# Patient Record
Sex: Female | Born: 1970 | Race: Black or African American | Hispanic: No | Marital: Married | Smoking: Never smoker
Health system: Southern US, Community
[De-identification: ages and names within clinical notes are randomized; demographics above are authoritative.]

## PROBLEM LIST (undated history)

## (undated) DIAGNOSIS — I10 Essential (primary) hypertension: Secondary | ICD-10-CM

## (undated) HISTORY — PX: BACK SURGERY: SHX140

## (undated) HISTORY — PX: ABDOMINAL HYSTERECTOMY: SHX81

---

## 2020-07-12 ENCOUNTER — Other Ambulatory Visit: Payer: Self-pay

## 2020-07-12 ENCOUNTER — Emergency Department (HOSPITAL_BASED_OUTPATIENT_CLINIC_OR_DEPARTMENT_OTHER)
Admission: EM | Admit: 2020-07-12 | Discharge: 2020-07-12 | Disposition: A | Discharge status: Home / Self Care | Payer: BLUE CROSS/BLUE SHIELD | Attending: Emergency Medicine | Admitting: Emergency Medicine

## 2020-07-12 ENCOUNTER — Encounter (HOSPITAL_BASED_OUTPATIENT_CLINIC_OR_DEPARTMENT_OTHER): Payer: Self-pay

## 2020-07-12 ENCOUNTER — Emergency Department (HOSPITAL_BASED_OUTPATIENT_CLINIC_OR_DEPARTMENT_OTHER): Payer: BLUE CROSS/BLUE SHIELD

## 2020-07-12 DIAGNOSIS — K56600 Partial intestinal obstruction, unspecified as to cause: Secondary | ICD-10-CM | POA: Diagnosis not present

## 2020-07-12 DIAGNOSIS — R1013 Epigastric pain: Secondary | ICD-10-CM | POA: Diagnosis present

## 2020-07-12 DIAGNOSIS — I10 Essential (primary) hypertension: Secondary | ICD-10-CM | POA: Diagnosis not present

## 2020-07-12 HISTORY — DX: Essential (primary) hypertension: I10

## 2020-07-12 LAB — CBC WITH DIFFERENTIAL/PLATELET
Abs Immature Granulocytes: 0.03 10*3/uL (ref 0.00–0.07)
Basophils Absolute: 0 10*3/uL (ref 0.0–0.1)
Basophils Relative: 0 %
Eosinophils Absolute: 0 10*3/uL (ref 0.0–0.5)
Eosinophils Relative: 0 %
HCT: 38.4 % (ref 36.0–46.0)
Hemoglobin: 13.4 g/dL (ref 12.0–15.0)
Immature Granulocytes: 0 %
Lymphocytes Relative: 16 %
Lymphs Abs: 1.1 10*3/uL (ref 0.7–4.0)
MCH: 30.9 pg (ref 26.0–34.0)
MCHC: 34.9 g/dL (ref 30.0–36.0)
MCV: 88.7 fL (ref 80.0–100.0)
Monocytes Absolute: 0.2 10*3/uL (ref 0.1–1.0)
Monocytes Relative: 2 %
Neutro Abs: 5.8 10*3/uL (ref 1.7–7.7)
Neutrophils Relative %: 82 %
Platelets: 354 10*3/uL (ref 150–400)
RBC: 4.33 MIL/uL (ref 3.87–5.11)
RDW: 12.1 % (ref 11.5–15.5)
WBC: 7.1 10*3/uL (ref 4.0–10.5)
nRBC: 0 % (ref 0.0–0.2)

## 2020-07-12 LAB — COMPREHENSIVE METABOLIC PANEL
ALT: 15 U/L (ref 0–44)
AST: 24 U/L (ref 15–41)
Albumin: 3.5 g/dL (ref 3.5–5.0)
Alkaline Phosphatase: 51 U/L (ref 38–126)
Anion gap: 12 (ref 5–15)
BUN: 11 mg/dL (ref 6–20)
CO2: 18 mmol/L — ABNORMAL LOW (ref 22–32)
Calcium: 7.8 mg/dL — ABNORMAL LOW (ref 8.9–10.3)
Chloride: 106 mmol/L (ref 98–111)
Creatinine, Ser: 0.7 mg/dL (ref 0.44–1.00)
GFR, Estimated: >60 mL/min (ref >60)
Glucose, Bld: 162 mg/dL — ABNORMAL HIGH (ref 70–99)
Potassium: 3.4 mmol/L — ABNORMAL LOW (ref 3.5–5.1)
Sodium: 136 mmol/L (ref 135–145)
Total Bilirubin: 1.2 mg/dL (ref 0.3–1.2)
Total Protein: 6.5 g/dL (ref 6.5–8.1)

## 2020-07-12 LAB — URINALYSIS, ROUTINE W REFLEX MICROSCOPIC
Bilirubin Urine: NEGATIVE
Glucose, UA: NEGATIVE mg/dL
Hgb urine dipstick: NEGATIVE
Ketones, ur: NEGATIVE mg/dL
Leukocytes,Ua: NEGATIVE
Nitrite: NEGATIVE
Protein, ur: NEGATIVE mg/dL
Specific Gravity, Urine: 1.02 (ref 1.005–1.030)
pH: 8.5 — ABNORMAL HIGH (ref 5.0–8.0)

## 2020-07-12 LAB — LIPASE, BLOOD: Lipase: 26 U/L (ref 11–51)

## 2020-07-12 MED ORDER — IOHEXOL 300 MG/ML  SOLN
100.0000 mL | Freq: Once | INTRAMUSCULAR | Status: AC | PRN
  Administered 2020-07-12: 100 mL via INTRAVENOUS

## 2020-07-12 MED ORDER — MORPHINE SULFATE (PF) 4 MG/ML IV SOLN
4.0000 mg | Freq: Once | INTRAVENOUS | Status: AC
  Administered 2020-07-12: 4 mg via INTRAVENOUS
  Filled 2020-07-12: qty 1

## 2020-07-12 MED ORDER — SODIUM CHLORIDE 0.9 % IV BOLUS
1000.0000 mL | Freq: Once | INTRAVENOUS | Status: AC
  Administered 2020-07-12: 1000 mL via INTRAVENOUS

## 2020-07-12 MED ORDER — ONDANSETRON HCL 4 MG/2ML IJ SOLN
4.0000 mg | Freq: Once | INTRAMUSCULAR | Status: AC
  Administered 2020-07-12: 4 mg via INTRAVENOUS
  Filled 2020-07-12: qty 2

## 2020-07-12 MED ORDER — ONDANSETRON 4 MG PO TBDP: 4.0000 mg | ORAL_TABLET | Freq: Three times a day (TID) | ORAL | 0 refills | Status: AC | PRN

## 2020-07-12 MED ORDER — HYDROCODONE-ACETAMINOPHEN 5-325 MG PO TABS
1.0000 | ORAL_TABLET | ORAL | 0 refills | Status: AC | PRN
Start: 2020-07-12 — End: ?

## 2020-07-12 NOTE — ED Notes (Signed)
CT awaiting results of CMP prior to imaging due to [facility] radiology protocols.

## 2020-07-12 NOTE — ED Notes (Signed)
AVS reviewed with pt and significant other, discussed the two Rx by the EDP as well, also discussed clear liquid diet with pt. Copy of AVS provided, opportunity for questions provided as well

## 2020-07-12 NOTE — Discharge Instructions (Signed)
Return immediately if symptoms worsen. 

## 2020-07-12 NOTE — ED Notes (Signed)
Pt aware of need for urine specimen, unable to provide at this time, IV fluids infusing. 

## 2020-07-12 NOTE — ED Provider Notes (Signed)
[facility] EMERGENCY DEPARTMENT Provider Note   CSN: [id number] Arrival date & time: 07/12/20  0847     History Chief Complaint  Patient presents with  . Abdominal Pain    Marie Evans is a 49 y.o. female.  Pt presents to the ED today with abdominal pain and n/v.  Pt said she is here visiting family.  She felt fine yesterday and woke up in the middle of the night with epigastric pain and n/v.  Pt denies any f/c.  She has had a hyst.        Past Medical History:  Diagnosis Date  . Hypertension     There are no problems to display for this patient.   Past Surgical History:  Procedure Laterality Date  . ABDOMINAL HYSTERECTOMY    . BACK SURGERY       OB History   No obstetric history on file.     History reviewed. No pertinent family history.  Social History   Tobacco Use  . Smoking status: Never Smoker  Substance Use Topics  . Alcohol use: Not Currently  . Drug use: Never    Home Medications Prior to Admission medications   Medication Sig Start Date End Date Taking? Authorizing Provider  HYDROcodone-acetaminophen (NORCO/VICODIN) 5-325 MG tablet Take 1 tablet by mouth every 4 (four) hours as needed. 07/12/20   Jacalyn Lefevre, MD  ondansetron (ZOFRAN ODT) 4 MG disintegrating tablet Take 1 tablet (4 mg total) by mouth every 8 (eight) hours as needed for nausea or vomiting. 07/12/20   Jacalyn Lefevre, MD    Allergies    Penicillins  Review of Systems   Review of Systems  Gastrointestinal: Positive for abdominal pain, nausea and vomiting.  All other systems reviewed and are negative.   Physical Exam Updated Vital Signs BP 137/86 (BP Location: Left Arm)   Pulse 64   Temp 97.8 F (36.6 C) (Oral)   Resp 16   Ht 5\' 2"  (1.575 m)   Wt 70.8 kg   SpO2 100%   BMI 28.53 kg/m   Physical Exam Vitals and nursing note reviewed.  Constitutional:      Appearance: She is well-developed. She is ill-appearing.  HENT:     Head:  Normocephalic and atraumatic.     Mouth/Throat:     Mouth: Mucous membranes are dry.  Eyes:     Extraocular Movements: Extraocular movements intact.     Pupils: Pupils are equal, round, and reactive to light.  Cardiovascular:     Rate and Rhythm: Normal rate and regular rhythm.  Pulmonary:     Effort: Pulmonary effort is normal.     Breath sounds: Normal breath sounds.  Abdominal:     General: Abdomen is flat.     Palpations: Abdomen is soft.     Tenderness: There is abdominal tenderness in the epigastric area.  Skin:    General: Skin is warm.     Capillary Refill: Capillary refill takes less than 2 seconds.  Neurological:     General: No focal deficit present.     Mental Status: She is alert and oriented to person, place, and time.  Psychiatric:        Mood and Affect: Mood normal.        Behavior: Behavior normal.     ED Results / Procedures / Treatments   Labs (all labs ordered are listed, but only abnormal results are displayed) Labs Reviewed  URINALYSIS, ROUTINE W REFLEX MICROSCOPIC - Abnormal; Notable for the  following components:      Result Value   APPearance CLOUDY (*)    pH 8.5 (*)    All other components within normal limits  COMPREHENSIVE METABOLIC PANEL - Abnormal; Notable for the following components:   Potassium 3.4 (*)    CO2 18 (*)    Glucose, Bld 162 (*)    Calcium 7.8 (*)    All other components within normal limits  CBC WITH DIFFERENTIAL/PLATELET  LIPASE, BLOOD    EKG None  Radiology CT ABDOMEN PELVIS W CONTRAST  Result Date: 07/12/2020 CLINICAL DATA:  Abdominal pain EXAM: CT ABDOMEN AND PELVIS WITH CONTRAST TECHNIQUE: Multidetector CT imaging of the abdomen and pelvis was performed using the standard protocol following bolus administration of intravenous contrast. CONTRAST:  OMNIPAQUE IOHEXOL 300 MG/ML  SOLN COMPARISON:  None. FINDINGS: Lower chest: Lung bases are clear. Hepatobiliary: Liver is within normal limits. No  suspicious/enhancing hepatic lesions. Pancreas: Within normal limits. Spleen: Within normal limits. Adrenals/Urinary Tract: Adrenal glands are within normal limits. Numerous bilateral renal cysts, some of which are mildly irregular, but without enhancement, benign (Bosniak I-II). 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 No hydronephrosis. Bladder is partially obscured by streak artifact but grossly unremarkable. 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 Dilated loops of small bowel in the left mid abdomen, suggesting at least partial small bowel obstruction, with narrowing/tethering of multiple loops in the central lower abdomen (coronal image 49), suggesting adhesions. No pneumatosis or free air. Normal appendix (series 2/image 51). Vascular/Lymphatic: No evidence of abdominal aortic aneurysm. No suspicious abdominopelvic lymphadenopathy. Reproductive: Status post hysterectomy. No adnexal masses. Other: Small volume pelvic ascites. Musculoskeletal: Thoracolumbar spine fixation hardware, incompletely visualized. IMPRESSION: Dilated loops of small bowel in the left mid abdomen, suggesting at least partial small bowel obstruction, likely on the basis of adhesions in the central lower abdomen. Small volume pelvic ascites.  No pneumatosis or free air. Electronically Signed   By: Charline Bills M.D.   On: 07/12/2020 11:33    Procedures Procedures (including critical care time)  Medications Ordered in ED Medications  sodium chloride 0.9 % bolus 1,000 mL (0 mLs Intravenous Stopped 07/12/20 1058)  morphine 4 MG/ML injection 4 mg (4 mg Intravenous Given 07/12/20 0926)  ondansetron (ZOFRAN) injection 4 mg (4 mg Intravenous Given 07/12/20 0924)  iohexol (OMNIPAQUE) 300 MG/ML solution 100 mL (100 mLs Intravenous Contrast Given 07/12/20 1112)    ED Course  I have reviewed the triage vital signs and the nursing notes.  Pertinent labs &  imaging results that were available during my care of the patient were reviewed by me and considered in my medical decision making (see chart for details).    MDM Rules/Calculators/A&P                          Pt is feeling much better.  She is offered admission, but does not want to stay in the hospital.  She is able to tolerate po fluids.  She knows to return if her sx worsen.   Final Clinical Impression(s) / ED Diagnoses Final diagnoses:  Partial small bowel obstruction (HCC)    Rx / DC Orders ED Discharge Orders         Ordered    ondansetron (ZOFRAN ODT) 4 MG disintegrating tablet  Every 8 hours PRN        07/12/20 1255    HYDROcodone-acetaminophen (NORCO/VICODIN) 5-325 MG tablet  Every 4  hours PRN        07/12/20 1255           Jacalyn Lefevre, MD 07/12/20 1257

## 2020-07-12 NOTE — ED Triage Notes (Signed)
Pt reports upper abdominal pain with n/v since last night. Pt actively vomiting during triage.

## 2021-11-03 IMAGING — CT CT ABD-PELV W/ CM
2 of 5 series · 16 of 46 positions shown, 18 images · IV contrast (Omnipaque)
Comparison: None.

CLINICAL DATA: Abdominal pain

EXAM:
CT ABDOMEN AND PELVIS WITH CONTRAST
TECHNIQUE: Multidetector CT imaging of the abdomen and pelvis was performed
using the standard protocol following bolus administration of
intravenous contrast.
CONTRAST:  100mL OMNIPAQUE IOHEXOL 300 MG/ML  SOLN

[Series 2: axial (person_name) (person_name) · axial · 0.82mm/px · z∈[+582,+922]mm · 13 of 78 slices shown, 15 images]
[im 5/78  soft-tissue]
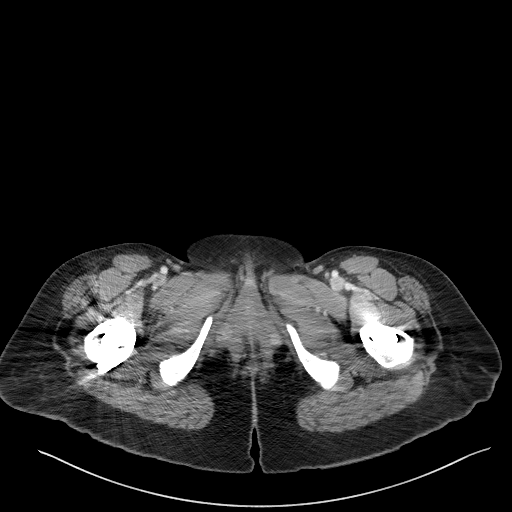
[im 5/78  bone]
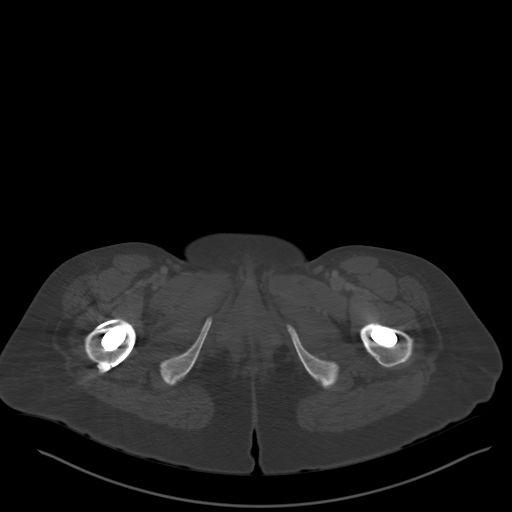
[im 10/78  soft-tissue]
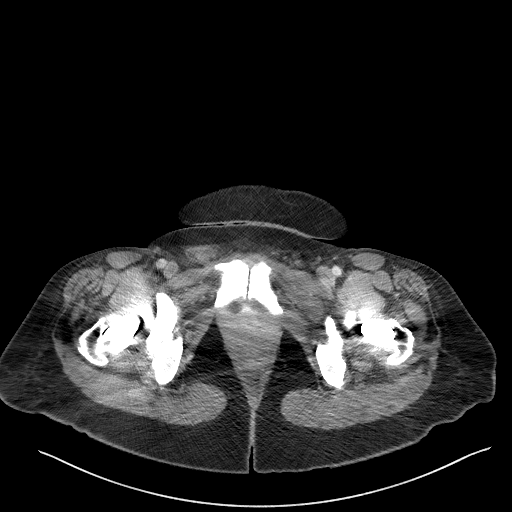
[im 19/78  soft-tissue]
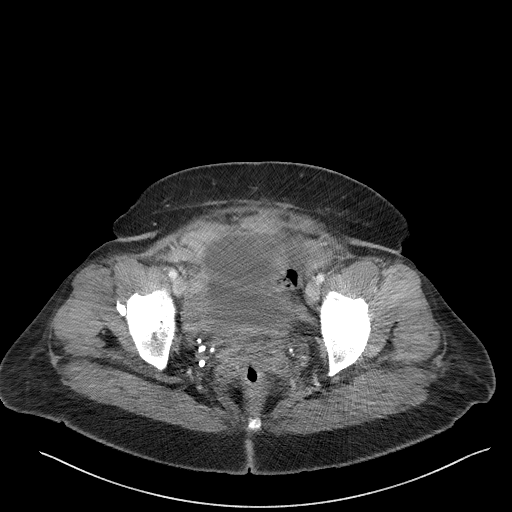
[im 23/78  soft-tissue]
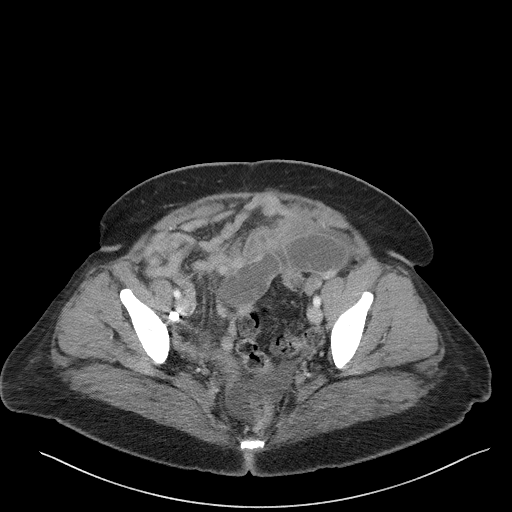
[im 28/78  soft-tissue]
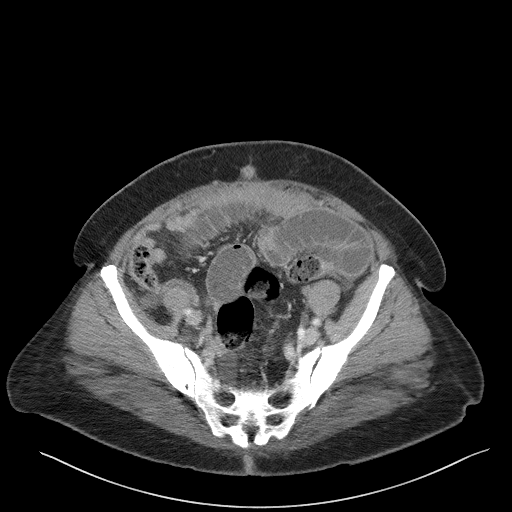
[im 32/78  soft-tissue]
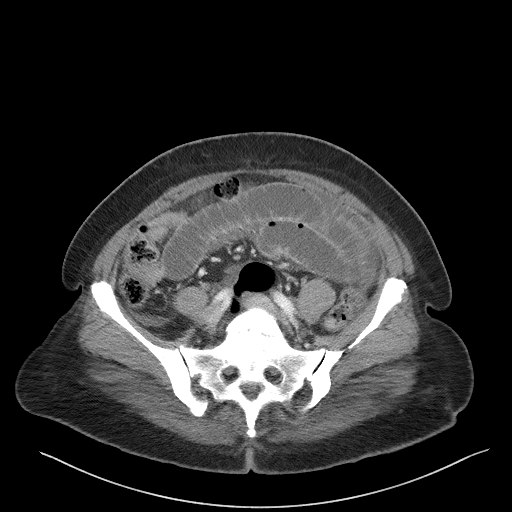
[im 41/78  soft-tissue]
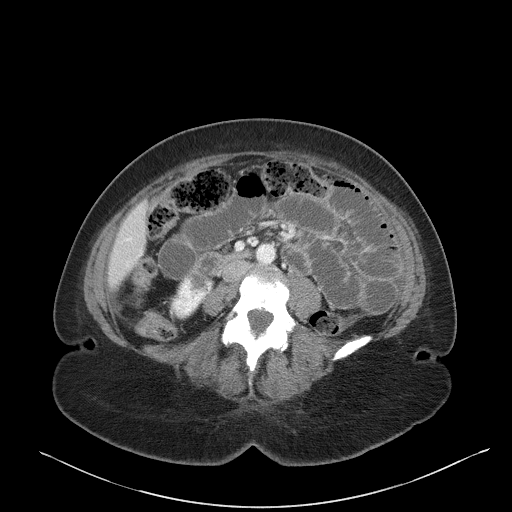
[im 46/78  soft-tissue]
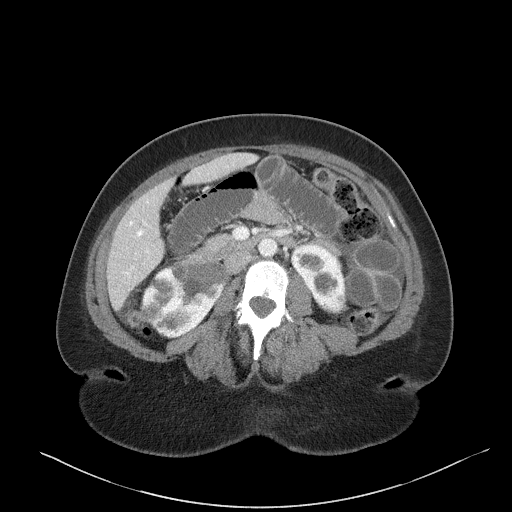
[im 50/78  soft-tissue]
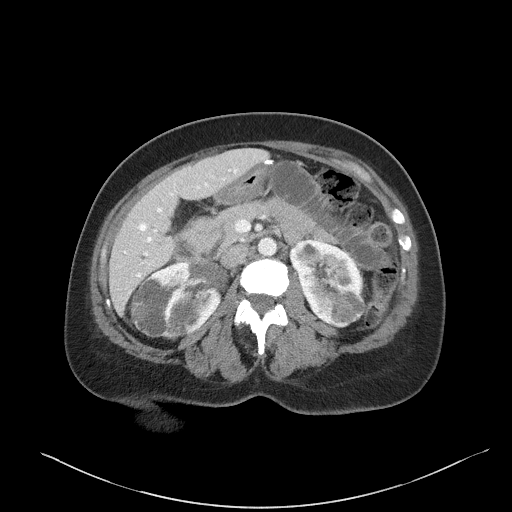
[im 50/78  bone]
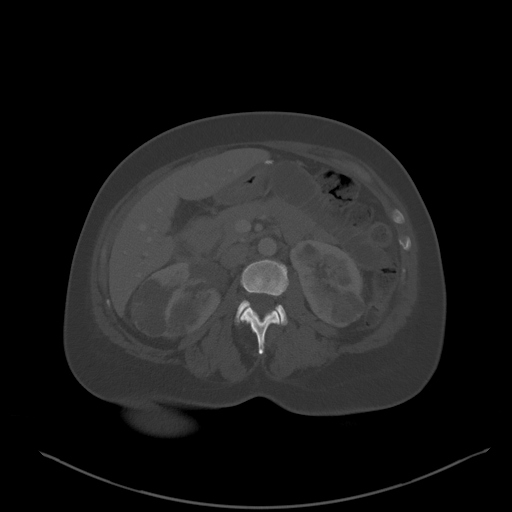
[im 55/78  soft-tissue]
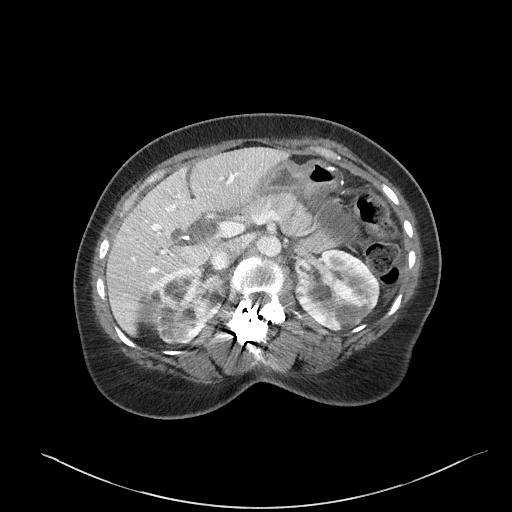
[im 59/78  soft-tissue]
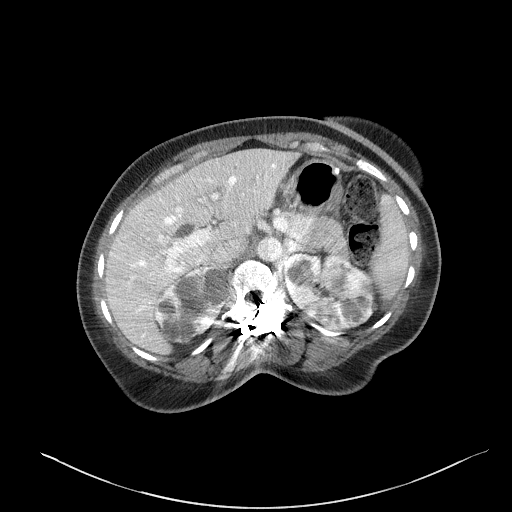
[im 68/78  soft-tissue]
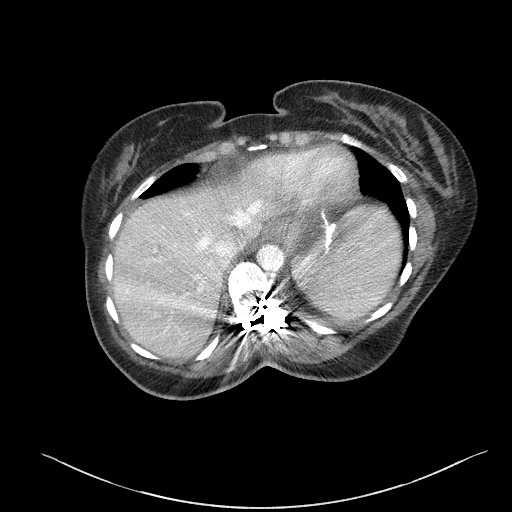
[im 73/78  soft-tissue]
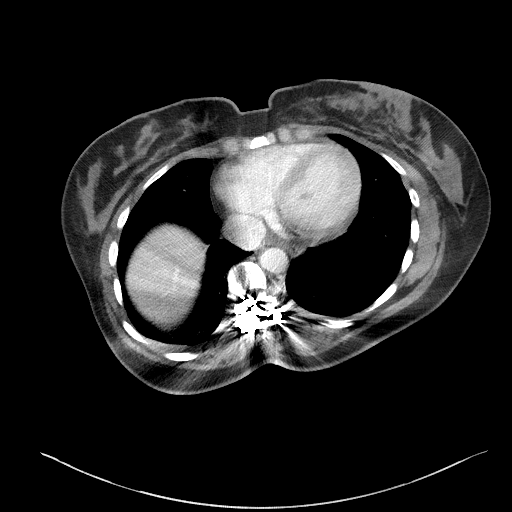

[Series 5: coronal st · coronal · 0.73mm/px · 3 of 112 slices shown]
[im 38/112  soft-tissue]
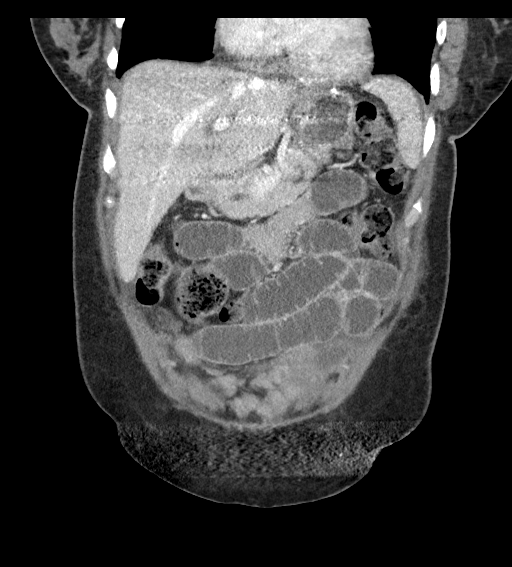
[im 50/112  soft-tissue]
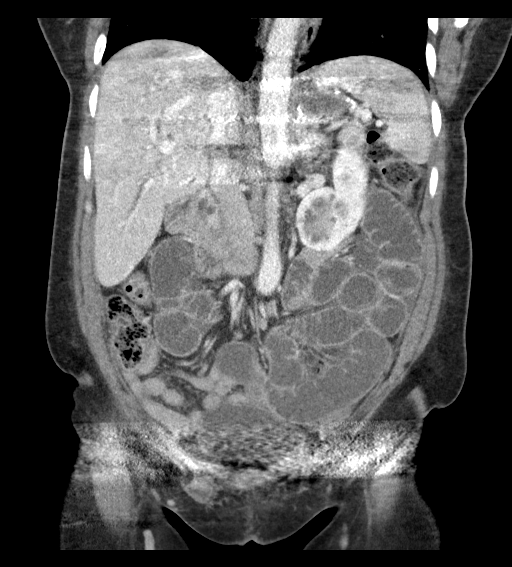
[im 62/112  soft-tissue]
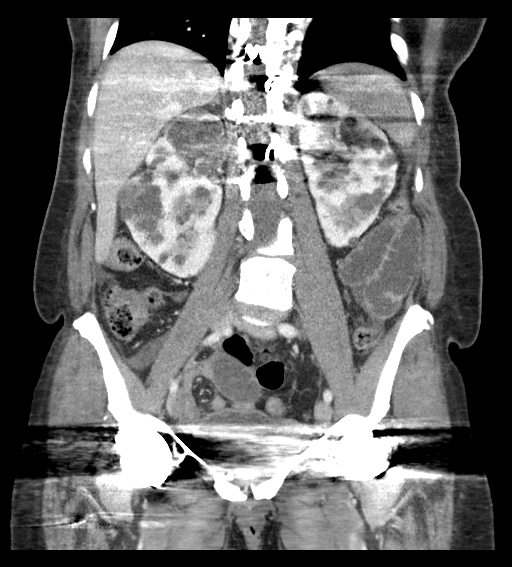

[16 of 46 positions shown; findings below may reference images not displayed]

FINDINGS: Lower chest: Lung bases are clear.

Hepatobiliary: Liver is within normal limits. No
suspicious/enhancing hepatic lesions.

Pancreas: Within normal limits.

Spleen: Within normal limits.

Adrenals/Urinary Tract: Adrenal glands are within normal limits.

Numerous bilateral renal cysts, some of which are mildly irregular,
but without enhancement, benign (Bosniak I-II). This appearance
favors autosomal dominant polycystic kidney disease. No
hydronephrosis.

Bladder is partially obscured by streak artifact but grossly
unremarkable.

Stomach/Bowel: Stomach is notable for postsurgical changes related
to gastric bypass.

Dilated loops of small bowel in the left mid abdomen, suggesting at
least partial small bowel obstruction, with narrowing/tethering of
multiple loops in the central lower abdomen (coronal image 49),
suggesting adhesions.

No pneumatosis or free air.

Normal appendix (series 2/image 51).

Vascular/Lymphatic: No evidence of abdominal aortic aneurysm.

No suspicious abdominopelvic lymphadenopathy.

Reproductive: Status post hysterectomy.

No adnexal masses.

Other: Small volume pelvic ascites.

Musculoskeletal: Thoracolumbar spine fixation hardware, incompletely
visualized.
IMPRESSION: Dilated loops of small bowel in the left mid abdomen, suggesting at
least partial small bowel obstruction, likely on the basis of
adhesions in the central lower abdomen.

Small volume pelvic ascites.  No pneumatosis or free air.
